# Patient Record
Sex: Male | Born: 1980 | Race: Black or African American | Hispanic: No | Marital: Married | Smoking: Never smoker
Health system: Southern US, Community
[De-identification: ages and names within clinical notes are randomized; demographics above are authoritative.]

---

## 2002-12-24 ENCOUNTER — Emergency Department (HOSPITAL_COMMUNITY): Admission: EM | Admit: 2002-12-24 | Discharge: 2002-12-24 | Payer: Self-pay | Admitting: Emergency Medicine

## 2002-12-24 ENCOUNTER — Encounter: Payer: Self-pay | Admitting: Emergency Medicine

## 2004-04-22 ENCOUNTER — Ambulatory Visit (HOSPITAL_BASED_OUTPATIENT_CLINIC_OR_DEPARTMENT_OTHER): Admission: RE | Admit: 2004-04-22 | Discharge: 2004-04-22 | Payer: Self-pay | Admitting: *Deleted

## 2007-11-01 ENCOUNTER — Emergency Department (HOSPITAL_COMMUNITY): Admission: EM | Admit: 2007-11-01 | Discharge: 2007-11-01 | Payer: Self-pay | Admitting: Emergency Medicine

## 2011-05-14 NOTE — Op Note (Signed)
NAME:  Thomas Foster, Thomas Foster                       ACCOUNT NO.:  [id number]   MEDICAL RECORD NO.:  [id number]                   PATIENT TYPE:  AMB   LOCATION:  DSC                                  FACILITY:  [facility]   PHYSICIAN:  Lowell Bouton, M.D.      DATE OF BIRTH:  1981-11-08   DATE OF PROCEDURE:  04/22/2004  DATE OF DISCHARGE:                                 OPERATIVE REPORT   PREOPERATIVE DIAGNOSIS:  Left scaphoid nonunion.   POSTOPERATIVE DIAGNOSIS:  Left scaphoid nonunion.   PROCEDURE:  Open reduction internal fixation of left scaphoid with left  iliac crest bone graft.   SURGEON:  Lowell Bouton, M.D.   ANESTHESIA:  General.   OPERATIVE FINDINGS:  The patient had an obvious nonunion through the distal  third of the scaphoid.  There were cystic changes at the fracture site and a  hump back deformity.  The articular surfaces were smooth.   PROCEDURE:  Under general anesthesia with a tourniquet on the left arm, the  left hand and left iliac crest were prepped and draped in the usual fashion.  After exsanguinating the limb, the tourniquet was inflated to 250 mmHg.  A  longitudinal incision was made in line with the FCR tendon and obliquely  across the distal flexor crease.  Sharp dissection was carried through the  subcutaneous tissues and bleeding points were coagulated.  Blunt dissection  was carried down to the FCR tendon and the sheath was incised.  The tendon  was retracted and sharp dissection was carried down to the scaphoid  tuberosity and then along the scaphoid to the distal radius.  The fracture  site was easily identified.  The periosteum was elevated radially and  ulnarly and the fracture site was debrided with a rongeur and a curet.  There was good bleeding from both sides of the nonunion site.  A 0.035 K-  wire was placed in the proximal pole of the scaphoid to be used as a  joystick and x-rays were obtained.  Lateral x-rays revealed a DISI  deformity, so with opening the nonunion site using the joystick, this  reduced the lunate.  It was determined that an iliac crest bone graft would  be required.  An oblique incision was then made over the left anterior iliac  crest and sharp dissection carried through the subcutaneous tissues with  electrocautery.  Electrocautery was used to cut down through the periosteum  on the top of the iliac crest.  Two osteotomes were used to take a wedge of  bone out of the top of the iliac crest.  A curved osteotome was used to  remove the deep portion of the graft.  The graft was then contoured and the  iliac crest site was packed open.  After contouring the graft, it was  inserted into the scaphoid nonunion site.  X-rays showed good position of  the lunate and direction of the hump back deformity.  A 0.035  K-wire was  inserted across the graft to hold it in place.  A second K-wire was then  inserted and x-ray showed good position of the K-wire.  The AccuTrac screw  was then repaired and a size 22.5-mm screw was measured.  The x-ray showed  good position of the K-wire, so the drill from the AccuTrac set was placed  over the K-wire and drilled.  This was done using power.  The drill was then  removed and the screw was inserted over the K-wire.  There was good  impression and good position of the screw.  It was recessed just below the  tuberosity's distal portion.  The K-wires were then removed and under  fluoroscopy the scaphoid appeared to be well aligned.  The wound was then  irrigated copiously.  The cancellous bone that remained was packed around  the corticocancellous graft to fill in any defects.  The fascia was then  closed with a 4-0 Vicryl and subcutaneous tissue was closed with 4-0 Vicryl  over a vessel loop drain.  The skin was closed with a 3-0 subcuticular  Prolene.  Steri-Strips were applied followed by sterile dressings and the  tourniquet was released.  Iliac crest donor site was  then irrigated  copiously with saline and bone wax was inserted to control the bleeding of  the bone.  The fascia was then closed with 0 chromic sutures and the  subcutaneous tissue  closed with 2-0 chromic.  The skin was closed with a 4-0 subcuticular  Prolene.  Steri-Strips were applied followed by sterile dressings.  A thumb  spica splint was placed on the wrist.  The patient tolerated the procedure  well and went to the recovery room awake and stable in good condition.                                               Lowell Bouton, M.D.    EMM/MEDQ  D:  04/22/2004  T:  04/22/2004  Job:  [id number]

## 2014-08-17 ENCOUNTER — Encounter (HOSPITAL_COMMUNITY): Payer: Self-pay | Admitting: Emergency Medicine

## 2014-08-17 ENCOUNTER — Emergency Department (HOSPITAL_COMMUNITY)
Admission: EM | Admit: 2014-08-17 | Discharge: 2014-08-17 | Disposition: A | Discharge status: Home / Self Care | Payer: Self-pay | Attending: Emergency Medicine | Admitting: Emergency Medicine

## 2014-08-17 DIAGNOSIS — X500XXA Overexertion from strenuous movement or load, initial encounter: Secondary | ICD-10-CM | POA: Insufficient documentation

## 2014-08-17 DIAGNOSIS — X503XXA Overexertion from repetitive movements, initial encounter: Secondary | ICD-10-CM | POA: Insufficient documentation

## 2014-08-17 DIAGNOSIS — Z79899 Other long term (current) drug therapy: Secondary | ICD-10-CM | POA: Insufficient documentation

## 2014-08-17 DIAGNOSIS — Y929 Unspecified place or not applicable: Secondary | ICD-10-CM | POA: Insufficient documentation

## 2014-08-17 DIAGNOSIS — Y9389 Activity, other specified: Secondary | ICD-10-CM | POA: Insufficient documentation

## 2014-08-17 DIAGNOSIS — S335XXA Sprain of ligaments of lumbar spine, initial encounter: Secondary | ICD-10-CM | POA: Insufficient documentation

## 2014-08-17 DIAGNOSIS — M549 Dorsalgia, unspecified: Secondary | ICD-10-CM | POA: Insufficient documentation

## 2014-08-17 DIAGNOSIS — S39012A Strain of muscle, fascia and tendon of lower back, initial encounter: Secondary | ICD-10-CM

## 2014-08-17 MED ORDER — CYCLOBENZAPRINE HCL 10 MG PO TABS: 10.0000 mg | ORAL_TABLET | Freq: Three times a day (TID) | ORAL | Status: AC | PRN

## 2014-08-17 NOTE — Discharge Instructions (Signed)
Take 600 mg ibuprofen every 6 hours  Back Pain, Adult Low back pain is very common. About 1 in 5 people have back pain.The cause of low back pain is rarely dangerous. The pain often gets better over time.About half of people with a sudden onset of back pain feel better in just 2 weeks. About 8 in 10 people feel better by 6 weeks.  CAUSES Some common causes of back pain include:  Strain of the muscles or ligaments supporting the spine.  Wear and tear (degeneration) of the spinal discs.  Arthritis.  Direct injury to the back. DIAGNOSIS Most of the time, the direct cause of low back pain is not known.However, back pain can be treated effectively even when the exact cause of the pain is unknown.Answering your caregiver's questions about your overall health and symptoms is one of the most accurate ways to make sure the cause of your pain is not dangerous. If your caregiver needs more information, he or she may order lab work or imaging tests (X-rays or MRIs).However, even if imaging tests show changes in your back, this usually does not require surgery. HOME CARE INSTRUCTIONS For many people, back pain returns.Since low back pain is rarely dangerous, it is often a condition that people can learn to [facility]ageon their own.   Remain active. It is stressful on the back to sit or stand in one place. Do not sit, drive, or stand in one place for more than 30 minutes at a time. Take short walks on level surfaces as soon as pain allows.Try to increase the length of time you walk each day.  Do not stay in bed.Resting more than 1 or 2 days can delay your recovery.  Do not avoid exercise or work.Your body is made to move.It is not dangerous to be active, even though your back may hurt.Your back will likely heal faster if you return to being active before your pain is gone.  Pay attention to your body when you bend and lift. Many people have less discomfortwhen lifting if they bend their knees,  keep the load close to their bodies,and avoid twisting. Often, the most comfortable positions are those that put less stress on your recovering back.  Find a comfortable position to sleep. Use a firm mattress and lie on your side with your knees slightly bent. If you lie on your back, put a pillow under your knees.  Only take over-the-counter or prescription medicines as directed by your caregiver. Over-the-counter medicines to reduce pain and inflammation are often the most helpful.Your caregiver may prescribe muscle relaxant drugs.These medicines help dull your pain so you can more quickly return to your normal activities and healthy exercise.  Put ice on the injured area.  Put ice in a plastic bag.  Place a towel between your skin and the bag.  Leave the ice on for 15-20 minutes, 03-04 times a day for the first 2 to 3 days. After that, ice and heat may be alternated to reduce pain and spasms.  Ask your caregiver about trying back exercises and gentle massage. This may be of some benefit.  Avoid feeling anxious or stressed.Stress increases muscle tension and can worsen back pain.It is important to recognize when you are anxious or stressed and learn ways to manage it.Exercise is a great option. SEEK MEDICAL CARE IF:  You have pain that is not relieved with rest or medicine.  You have pain that does not improve in 1 week.  You have new symptoms.  You are generally not feeling well. SEEK IMMEDIATE MEDICAL CARE IF:   You have pain that radiates from your back into your legs.  You develop new bowel or bladder control problems.  You have unusual weakness or numbness in your arms or legs.  You develop nausea or vomiting.  You develop abdominal pain.  You feel faint. Document Released: 12/13/2005 Document Revised: 06/13/2012 Document Reviewed: 04/16/2014 [facility] Patient Information 2015 [place], [place]. This information is not intended to replace advice given to you by your  health care provider. Make sure you discuss any questions you have with your health care provider.

## 2014-08-17 NOTE — ED Notes (Signed)
Patient complains of lower back pain that started Thursday. States lifting a box, around 60 lbs, and setting it down started having back pain. Patient states he tried stretching back Friday morning with some improvement. States was standing and pain returned. States sitting makes pain worse.

## 2014-08-17 NOTE — ED Provider Notes (Signed)
CSN: [id number]     Arrival date & time 08/17/14  0805 History   First MD Initiated Contact with Patient 08/17/14 [phone]     Chief Complaint  Patient presents with  . Back Pain     HPI Patient complains of lower back pain that started Thursday. States lifting a box, around 60 lbs, and setting it down started having back pain. Patient states he tried stretching back Friday morning with some improvement. States was standing and pain returned. States sitting makes pain worse.  History reviewed. No pertinent past medical history. History reviewed. No pertinent past surgical history. No family history on file. History  Substance Use Topics  . Smoking status: Never Smoker   . Smokeless tobacco: Not on file  . Alcohol Use: No    Review of Systems  All other systems reviewed and are negative  Allergies  Review of patient's allergies indicates no known allergies.  Home Medications   Prior to Admission medications   Medication Sig Start Date End Date Taking? Authorizing Provider  cyclobenzaprine (FLEXERIL) 10 MG tablet Take 1 tablet (10 mg total) by mouth 3 (three) times daily as needed for muscle spasms. 08/17/14   Nelia Shiobert L Evanell Redlich, MD   BP 141/85  Pulse 61  Temp(Src) 97.9 F (36.6 C) (Oral)  SpO2 100% Physical Exam  Musculoskeletal:       Lumbar back: He exhibits tenderness, pain and spasm. He exhibits no bony tenderness.   Physical Exam  Nursing note and vitals reviewed. Constitutional: He is oriented to person, place, and time. He appears well-developed and well-nourished. No distress.  HENT:  Head: Normocephalic and atraumatic.  Eyes: Pupils are equal, round, and reactive to light.  Neck: Normal range of motion.  Cardiovascular: Normal rate and intact distal pulses.   Pulmonary/Chest: No respiratory distress.  Abdominal: Normal appearance. He exhibits no distension.  Neurology: No weakness, numbness, no ataxia or walking difficult.  No focal or lateralizing neurologic  findings Skin: Skin is warm and dry. No rash noted.  Psychiatric: He has a normal mood and affect. His behavior is normal.   ED Course  Procedures (including critical care time)  Medications - No data to display  Labs Review Labs Reviewed - No data to display  Imaging Review No results found.    MDM   Final diagnoses:  Back strain, initial encounter        Nelia Shiobert L Shefali Ng, MD 08/25/14 1640

## 2019-10-28 ENCOUNTER — Encounter (HOSPITAL_COMMUNITY): Payer: Self-pay | Admitting: *Deleted

## 2019-10-28 ENCOUNTER — Ambulatory Visit (HOSPITAL_COMMUNITY)
Admission: EM | Admit: 2019-10-28 | Discharge: 2019-10-28 | Disposition: A | Discharge status: Home / Self Care | Payer: 59 | Attending: Physician Assistant | Admitting: Physician Assistant

## 2019-10-28 ENCOUNTER — Other Ambulatory Visit: Payer: Self-pay

## 2019-10-28 DIAGNOSIS — R1032 Left lower quadrant pain: Secondary | ICD-10-CM

## 2019-10-28 MED ORDER — METRONIDAZOLE 500 MG PO TABS: 500.0000 mg | ORAL_TABLET | Freq: Three times a day (TID) | ORAL | 0 refills | Status: AC

## 2019-10-28 MED ORDER — CIPROFLOXACIN HCL 500 MG PO TABS
500.0000 mg | ORAL_TABLET | Freq: Two times a day (BID) | ORAL | 0 refills | Status: AC
Start: 2019-10-28 — End: ?

## 2019-10-28 NOTE — ED Triage Notes (Signed)
C/O intermittent LLQ abd pain x 2 wks with progressive worsening.  Denies any n/v/d.  Denies fevers.  Last BM this AM.  Denies constipation.  Denies any bulging to area.

## 2019-10-28 NOTE — ED Provider Notes (Signed)
[facility]    CSN: [id number] Arrival date & time: 10/28/19  1003      History   Chief Complaint Chief Complaint  Patient presents with  . Abdominal Pain    HPI Thomas Foster is a 38 y.o. male.   Patient here concerned with LLQ abdominal pain x 2 weeks which does not radiate.  Pain is constant, which worsens occasionally.  Pain currently 2/10 on pain scale, maximally 5/10.  States when pain worsens it lasts for minutes, usually only happens once per day.  He has not tried any medication for this pain.  Denies fever, chills, nausea, vomiting, diarrhea, constipation, hematochezia, melena, mucus in stool, GU symptoms, denies masses or bulges.  Nothing makes the pain better, nothing makes it worse.  He thinks it could be due to lifting boxes at work.  No remarkable PMH.     History reviewed. No pertinent past medical history.  There are no active problems to display for this patient.   History reviewed. No pertinent surgical history.     Home Medications    Prior to Admission medications   Medication Sig Start Date End Date Taking? Authorizing Provider  ciprofloxacin (CIPRO) 500 MG tablet Take 1 tablet (500 mg total) by mouth 2 (two) times daily. 10/28/19   Evern CoreLindquist, Erilyn Pearman, PA-C  cyclobenzaprine (FLEXERIL) 10 MG tablet Take 1 tablet (10 mg total) by mouth 3 (three) times daily as needed for muscle spasms. 08/17/14   Nelva NayBeaton, Robert, MD  metroNIDAZOLE (FLAGYL) 500 MG tablet Take 1 tablet (500 mg total) by mouth 3 (three) times daily for 7 days. 10/28/19 11/04/19  Evern CoreLindquist, Dalyce Renne, PA-C    Family History Family History  Problem Relation Age of Onset  . Healthy Mother   . Healthy Father     Social History Social History   Tobacco Use  . Smoking status: Never Smoker  . Smokeless tobacco: Never Used  Substance Use Topics  . Alcohol use: Yes    Comment: occasionally  . Drug use: Never     Allergies   Patient has no known allergies.   Review of  Systems Review of Systems  Constitutional: Negative for activity change, appetite change, chills, fatigue and fever.  Gastrointestinal: Positive for abdominal pain. Negative for abdominal distention, blood in stool, constipation, diarrhea, nausea, rectal pain and vomiting.  Genitourinary: Negative for decreased urine volume, difficulty urinating, discharge, dysuria, flank pain, genital sores, hematuria, penile pain, scrotal swelling, testicular pain and urgency.  Musculoskeletal: Negative for back pain, gait problem and myalgias.  Skin: Negative for color change, pallor and rash.  Neurological: Negative for dizziness, light-headedness and headaches.  Hematological: Negative for adenopathy. Does not bruise/bleed easily.  Psychiatric/Behavioral: Negative for confusion, dysphoric mood and sleep disturbance.  All other systems reviewed and are negative.    Physical Exam Triage Vital Signs ED Triage Vitals  Enc Vitals Group     BP 10/28/19 1012 (!) 150/93     Pulse Rate 10/28/19 1012 75     Resp 10/28/19 1012 16     Temp 10/28/19 1012 (!) 97.3 F (36.3 C)     Temp Source 10/28/19 1012 Other     SpO2 10/28/19 1012 97 %     Weight --      Height --      Head Circumference --      Peak Flow --      Pain Score 10/28/19 1013 2     Pain Loc --  Pain Edu? --      Excl. in GC? --    No data found.  Updated Vital Signs BP (!) 150/93   Pulse 75   Temp (!) 97.3 F (36.3 C) (Other (Comment))   Resp 16   SpO2 97%   Visual Acuity Right Eye Distance:   Left Eye Distance:   Bilateral Distance:    Right Eye Near:   Left Eye Near:    Bilateral Near:     Physical Exam Vitals signs and nursing note reviewed.  Constitutional:      General: He is not in acute distress.    Appearance: He is well-developed. He is not ill-appearing or toxic-appearing.  HENT:     Head: Normocephalic and atraumatic.  Eyes:     Conjunctiva/sclera: Conjunctivae normal.  Neck:     Musculoskeletal:  Neck supple.  Cardiovascular:     Rate and Rhythm: Normal rate and regular rhythm.     Heart sounds: No murmur.  Pulmonary:     Effort: Pulmonary effort is normal. No respiratory distress.     Breath sounds: Normal breath sounds.  Abdominal:     General: Abdomen is flat. Bowel sounds are normal. There is no distension.     Palpations: Abdomen is soft. There is no shifting dullness, fluid wave, hepatomegaly or splenomegaly.     Tenderness: There is abdominal tenderness in the left lower quadrant. There is no right CVA tenderness, left CVA tenderness, guarding or rebound. Negative signs include Rovsing's sign and psoas sign.     Hernia: No hernia is present. There is no hernia in the umbilical area, ventral area, left inguinal area or right inguinal area.  Genitourinary:    Penis: Normal.      Scrotum/Testes: Normal. Cremasteric reflex is present.        Right: Mass, tenderness or swelling not present.        Left: Mass, tenderness or swelling not present.     Epididymis:     Right: Normal.     Left: Normal.  Skin:    General: Skin is warm and dry.     Capillary Refill: Capillary refill takes less than 2 seconds.  Neurological:     General: No focal deficit present.     Mental Status: He is alert and oriented to person, place, and time.     Cranial Nerves: No cranial nerve deficit.     Motor: No weakness.  Psychiatric:        Mood and Affect: Mood normal.        Behavior: Behavior normal.      UC Treatments / Results  Labs (all labs ordered are listed, but only abnormal results are displayed) Labs Reviewed - No data to display UA - small blood in urine, otherwise normal.  EKG   Radiology No results found.  Procedures Procedures (including critical care time)  Medications Ordered in UC Medications - No data to display  Initial Impression / Assessment and Plan / UC Course  I have reviewed the triage vital signs and the nursing notes.  Pertinent labs & imaging  results that were available during my care of the patient were reviewed by me and considered in my medical decision making (see chart for details).      Final Clinical Impressions(s) / UC Diagnoses   Final diagnoses:  Left lower quadrant abdominal pain  Due to location and nature of pain, will treat for diverticulitis, patient instructed to follow up with PCP in  2 - 3 days, or go to ER with worsening symptoms. Side effects of medication discussed in detail.   Discharge Instructions     Follow up with PCP if no improvement in 2 - 3 days. Go to ER with worsening symptoms. Take medication as prescribed. Do not drink alcohol while on antibiotics.    ED Prescriptions    Medication Sig Dispense Auth. Provider   ciprofloxacin (CIPRO) 500 MG tablet Take 1 tablet (500 mg total) by mouth 2 (two) times daily. 14 tablet Peri Jefferson, PA-C   metroNIDAZOLE (FLAGYL) 500 MG tablet Take 1 tablet (500 mg total) by mouth 3 (three) times daily for 7 days. 21 tablet Peri Jefferson, PA-C     PDMP not reviewed this encounter.   Peri Jefferson, PA-C 10/28/19 1107

## 2019-10-28 NOTE — Discharge Instructions (Signed)
Follow up with PCP if no improvement in 2 - 3 days. Go to ER with worsening symptoms. Take medication as prescribed. Do not drink alcohol while on antibiotics.

## 2019-10-29 LAB — POCT URINALYSIS DIP (DEVICE)
Bilirubin Urine: NEGATIVE
Glucose, UA: NEGATIVE mg/dL
Ketones, ur: NEGATIVE mg/dL
Leukocytes,Ua: NEGATIVE
Nitrite: NEGATIVE
Protein, ur: NEGATIVE mg/dL
Specific Gravity, Urine: >=1.03 (ref 1.005–1.030)
Urobilinogen, UA: 0.2 mg/dL (ref 0.0–1.0)
pH: 5.5 (ref 5.0–8.0)

## 2020-06-11 ENCOUNTER — Emergency Department (HOSPITAL_COMMUNITY): Payer: 59

## 2020-06-11 ENCOUNTER — Encounter (HOSPITAL_COMMUNITY): Payer: Self-pay | Admitting: *Deleted

## 2020-06-11 ENCOUNTER — Emergency Department (HOSPITAL_COMMUNITY)
Admission: EM | Admit: 2020-06-11 | Discharge: 2020-06-11 | Disposition: A | Discharge status: Home / Self Care | Payer: 59 | Attending: Emergency Medicine | Admitting: Emergency Medicine

## 2020-06-11 ENCOUNTER — Other Ambulatory Visit: Payer: Self-pay

## 2020-06-11 DIAGNOSIS — M25561 Pain in right knee: Secondary | ICD-10-CM

## 2020-06-11 DIAGNOSIS — M25531 Pain in right wrist: Secondary | ICD-10-CM | POA: Diagnosis present

## 2020-06-11 NOTE — ED Provider Notes (Signed)
  Face-to-face evaluation   History: Injury right wrist and right knee and a fall 1 week ago.  He has mild pain with movement, and no other complaints.  Physical exam: Alert, calm, cooperative.  No apparent distress.  He is using both hands to manipulate his telephone when I saw him.  Wrist is without obvious swelling and has normal range of motion.  Clinical Course as of Jun 11 2301  Wed Jun 11, 2020  2225 No fracture, interpreted by me  DG Wrist Complete Right [EW]  2225 No fracture, interpreted by me  DG Knee Complete 4 Views Right [EW]    Clinical Course User Index [EW] Mancel Bale, MD    Medical screening examination/treatment/procedure(s) were conducted as a shared visit with non-physician practitioner(s) and myself.  I personally evaluated the patient during the encounter    Mancel Bale, MD 06/11/20 2302

## 2020-06-11 NOTE — Discharge Instructions (Addendum)
Please make sure to continue to take over-the-counter Tylenol or ibuprofen for pain.  With ibuprofen, please be cautious about kidney damage and bleeding.  If your symptoms are worsening or do not improve, please follow-up with the orthopedics office provided.  Return to the ER if you have sudden numbness, tingling, weakness, and inability to walk.

## 2020-06-11 NOTE — ED Notes (Signed)
Patient verbalizes understanding of discharge instructions. Opportunity for questioning and answers were provided. Armband removed by staff, pt discharged from ED ambulatory.   
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
## 2020-06-11 NOTE — ED Provider Notes (Signed)
[facility] EMERGENCY DEPARTMENT Provider Note   CSN: [id number] Arrival date & time: 06/11/20  1900     History Chief Complaint  Patient presents with  . Wrist Pain  . Knee Pain    Thomas Foster is a 39 y.o. male.  HPI 39 year old male with no significant medical history presents to the ER for right wrist and right knee pain.  Patient states that he was playing basketball with some friends approximately a week ago and was pushed backwards.  He caught himself with his wrist and fell backwards.  He does not know of any specific injury to his right knee.  Notes pain to his right wrist, mildly decreased range of motion secondary to pain.  He also states that he has been having some intermittent swelling to his right knee, though it is not currently swollen.  He has been able to ambulate without difficulty.  He has taken over-the-counter ibuprofen for his pain.  Denies any weakness, numbness, tingling, fevers, chills.  No history IVDU.  No other complaints today.    History reviewed. No pertinent past medical history.  There are no problems to display for this patient.   History reviewed. No pertinent surgical history.     Family History  Problem Relation Age of Onset  . Healthy Mother   . Healthy Father     Social History   Tobacco Use  . Smoking status: Never Smoker  . Smokeless tobacco: Never Used  Vaping Use  . Vaping Use: Never used  Substance Use Topics  . Alcohol use: Yes    Comment: occasionally  . Drug use: Never    Home Medications Prior to Admission medications   Medication Sig Start Date End Date Taking? Authorizing Provider  ciprofloxacin (CIPRO) 500 MG tablet Take 1 tablet (500 mg total) by mouth 2 (two) times daily. 10/28/19   Evern Core, PA-C  cyclobenzaprine (FLEXERIL) 10 MG tablet Take 1 tablet (10 mg total) by mouth 3 (three) times daily as needed for muscle spasms. 08/17/14   Nelva Nay, MD    Allergies    Patient  has no known allergies.  Review of Systems   Review of Systems  Musculoskeletal: Positive for arthralgias. Negative for back pain, neck pain and neck stiffness.  Skin: Negative for rash and wound.    Physical Exam Updated Vital Signs BP (!) 161/87 (BP Location: Right Arm)   Pulse 74   Temp 98.7 F (37.1 C) (Oral)   Resp 20   Ht 5\' 11"  (1.803 m)   Wt 87.1 kg   SpO2 99%   BMI 26.78 kg/m   Physical Exam Vitals and nursing note reviewed.  Constitutional:      General: He is not in acute distress.    Appearance: Normal appearance. He is well-developed. He is not ill-appearing, toxic-appearing or diaphoretic.  HENT:     Head: Normocephalic and atraumatic.     Nose: Nose normal.     Mouth/Throat:     Mouth: Mucous membranes are moist.  Eyes:     Conjunctiva/sclera: Conjunctivae normal.     Pupils: Pupils are equal, round, and reactive to light.  Cardiovascular:     Rate and Rhythm: Normal rate and regular rhythm.     Pulses: Normal pulses.     Heart sounds: Normal heart sounds. No murmur heard.   Pulmonary:     Effort: Pulmonary effort is normal. No respiratory distress.     Breath sounds: Normal breath sounds.  Abdominal:     Palpations: Abdomen is soft.     Tenderness: There is no abdominal tenderness.  Musculoskeletal:        General: Tenderness present. No swelling, deformity or signs of injury.       Arms:     Cervical back: Normal range of motion and neck supple.     Right lower leg: No edema.     Left lower leg: No edema.       Legs:     Comments: Right wrist with mild tenderness to palpation over ulna.  5/5 strength in hand, full range of motion of fingers.  2+ radial pulses.  Slightly decreased range of motion of right wrist secondary to pain.  <2 cap refill.  Sensations intact.  No snuffbox tenderness.  No noticeable crepitus, erythema, swelling, abscesses.  Right knee with no evidence of ecchymosis, erythema, swelling.  Mild tenderness to palpation to the  superior portion of the knee.  Full range of motion of joint, 5/5 strength and sensations intact in lower extremities.  Negative Lachman's.  Skin:    General: Skin is warm and dry.     Capillary Refill: Capillary refill takes less than 2 seconds.     Findings: No bruising, erythema or rash.  Neurological:     General: No focal deficit present.     Mental Status: He is alert and oriented to person, place, and time.     Sensory: No sensory deficit.     Motor: No weakness.  Psychiatric:        Mood and Affect: Mood normal.     ED Results / Procedures / Treatments   Labs (all labs ordered are listed, but only abnormal results are displayed) Labs Reviewed - No data to display  EKG None  Radiology No results found.  Procedures Procedures (including critical care time)  Medications Ordered in ED Medications - No data to display  ED Course  I have reviewed the triage vital signs and the nursing notes.  Pertinent labs & imaging results that were available during my care of the patient were reviewed by me and considered in my medical decision making (see chart for details).    MDM Rules/Calculators/A&P                         39 year old male with right wrist and right knee pain after falling while playing basketball. Physical exam without neurologic deficits.  No noticeable erythema, bruising, swelling to the right wrist or the right knee.  Right wrist with mild tenderness to palpation over the right ulna, and mildly decreased range of motion secondary to pain.  No snuffbox tenderness, no crepitus, deformities, erythema, swelling.  Strong pulses.  Right knee with no evidence of effusion, deformities, bruising.  Mild tendon tenderness to the anterior portion of the knee.  Negative Lachman's.  Strength and sensations intact.  Will order plain films to rule out fractures. Concern for meniscus, ACL tear low.   Signed out care to Dr. Eulis Foster. He will follow up on plain films and determine  appropriate disposition. Suspect patient will be stable for follow-up with orthopedics.   Final Clinical Impression(s) / ED Diagnoses Final diagnoses:  Right wrist pain  Acute pain of right knee    Rx / DC Orders ED Discharge Orders    None       Thomas Foster 06/11/20 2158    Daleen Bo, MD 06/11/20 2303

## 2021-03-08 IMAGING — CR DG KNEE COMPLETE 4+V*R*
4 series · 4 of 4 positions shown · non-contrast
Comparison: None.

CLINICAL DATA: Fall, knee pain

EXAM:
RIGHT KNEE - COMPLETE 4+ VIEW

[knee ap]
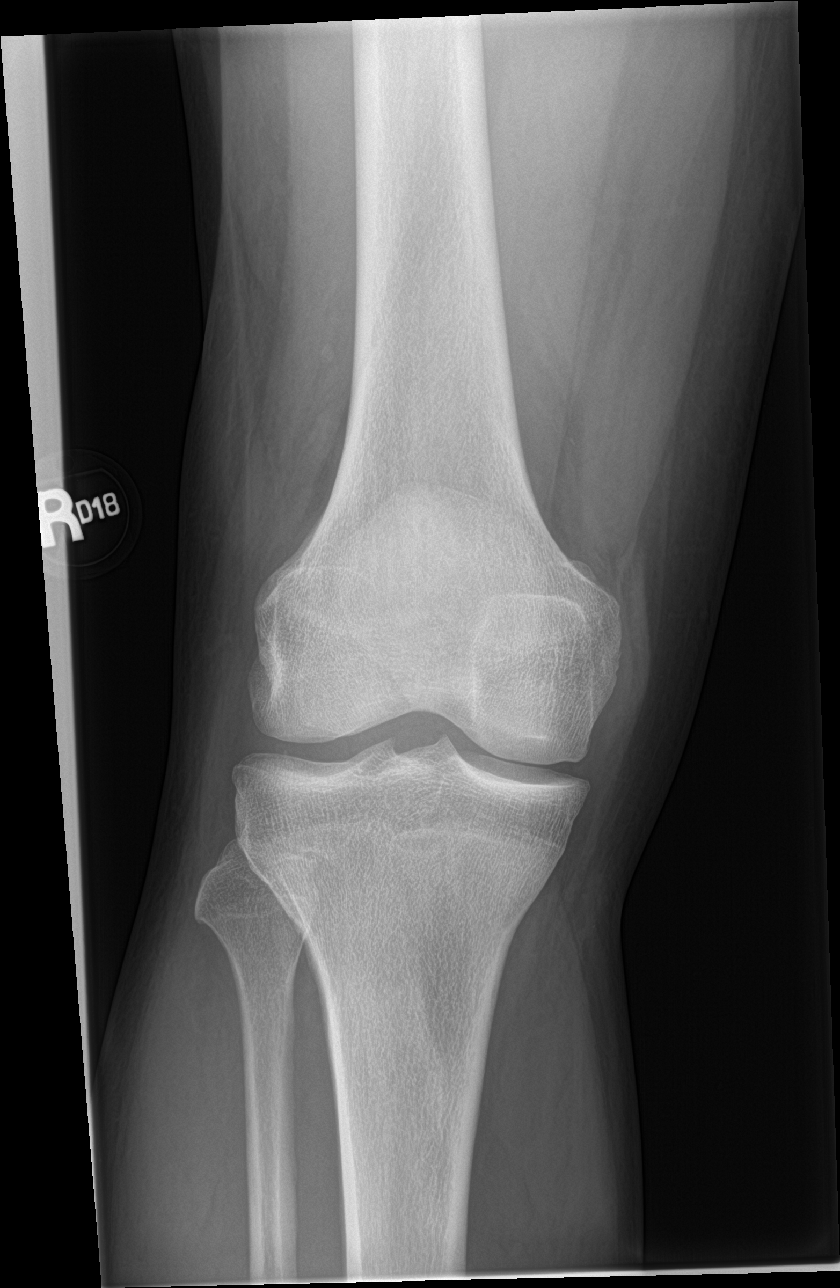

[knee lat]
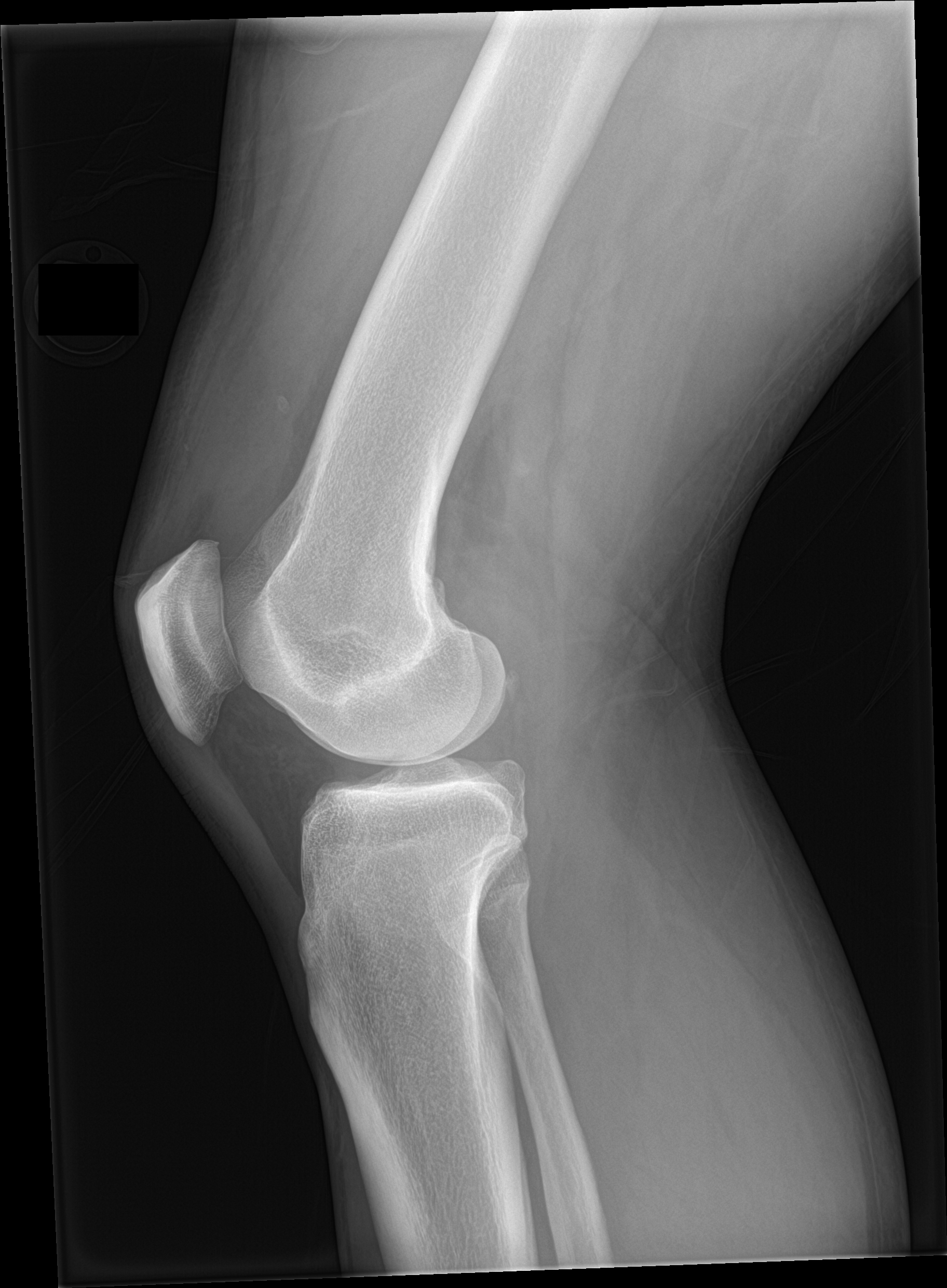

[knee obl (1 of 2)]
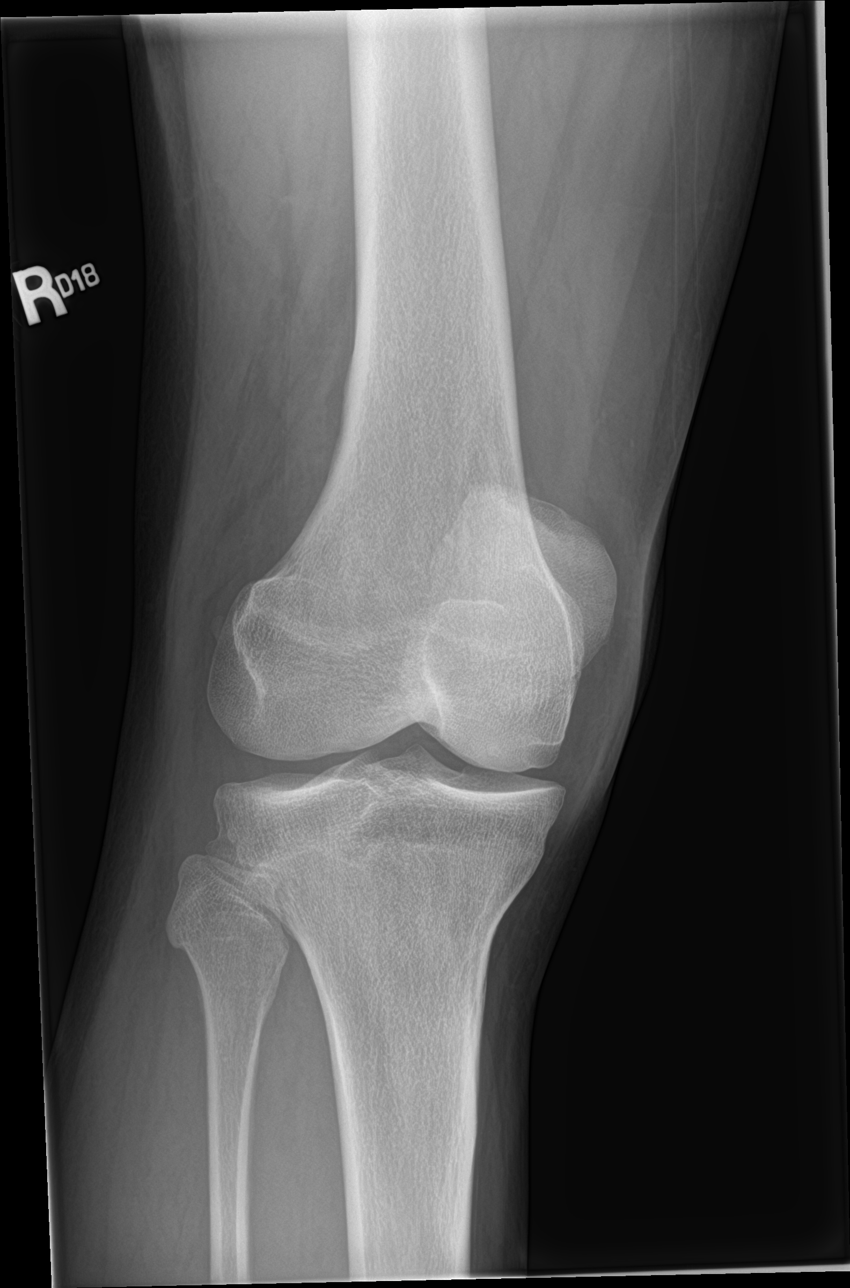

[knee obl (2 of 2)]
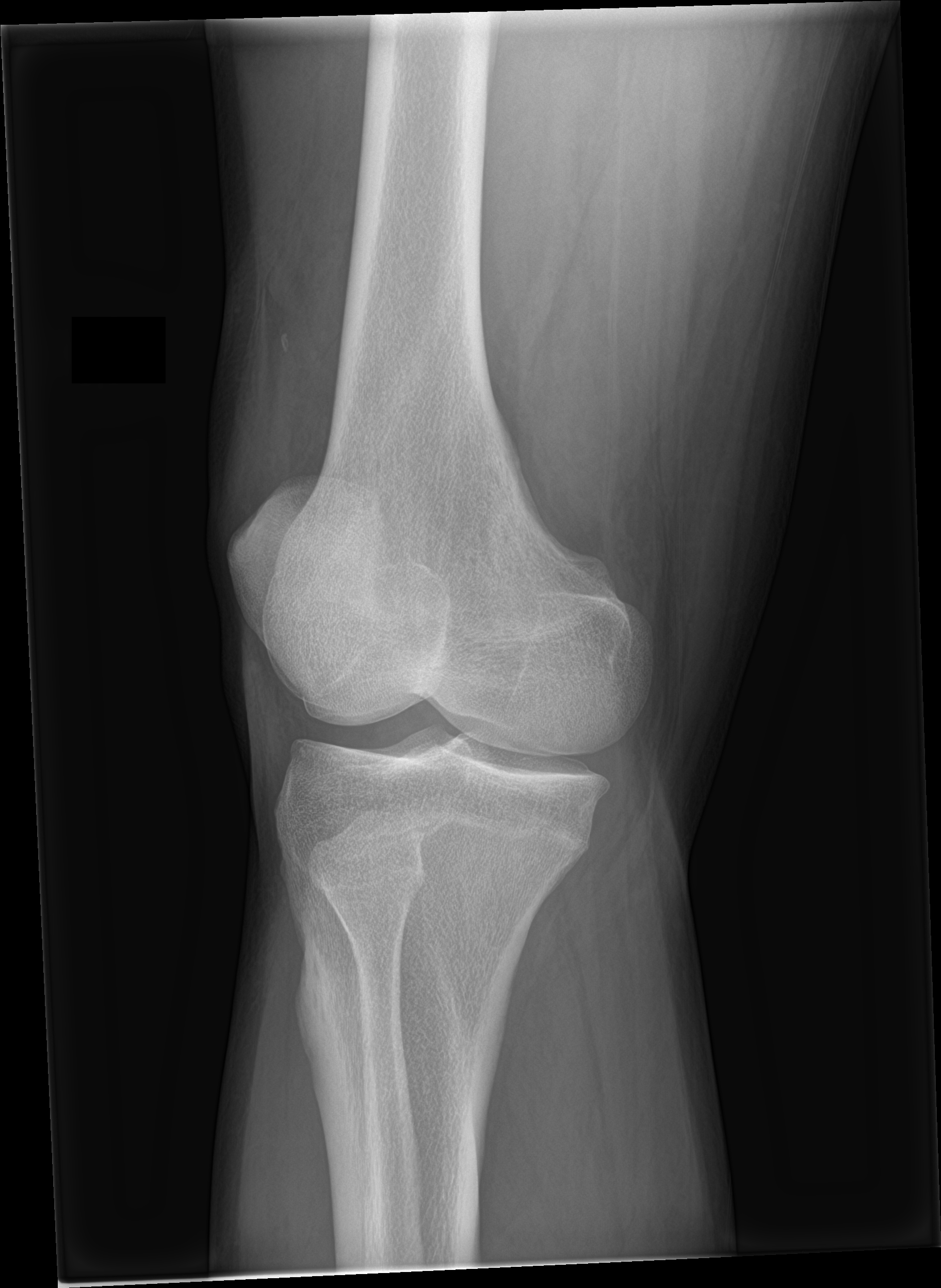

[4 of 4 positions shown; findings below may reference images not displayed]

FINDINGS: Small joint effusion. Joint spaces maintained. No acute bony
abnormality. Specifically, no fracture, subluxation, or dislocation.
IMPRESSION: No acute bony abnormality.  Small joint effusion.

## 2024-01-05 ENCOUNTER — Emergency Department (HOSPITAL_BASED_OUTPATIENT_CLINIC_OR_DEPARTMENT_OTHER)
Admission: EM | Admit: 2024-01-05 | Discharge: 2024-01-05 | Disposition: A | Discharge status: Home / Self Care | Payer: 59 | Attending: Emergency Medicine | Admitting: Emergency Medicine

## 2024-01-05 ENCOUNTER — Other Ambulatory Visit: Payer: Self-pay

## 2024-01-05 ENCOUNTER — Encounter (HOSPITAL_BASED_OUTPATIENT_CLINIC_OR_DEPARTMENT_OTHER): Payer: Self-pay

## 2024-01-05 DIAGNOSIS — W228XXA Striking against or struck by other objects, initial encounter: Secondary | ICD-10-CM | POA: Diagnosis not present

## 2024-01-05 DIAGNOSIS — Y990 Civilian activity done for income or pay: Secondary | ICD-10-CM | POA: Insufficient documentation

## 2024-01-05 DIAGNOSIS — R55 Syncope and collapse: Secondary | ICD-10-CM | POA: Diagnosis not present

## 2024-01-05 DIAGNOSIS — S0990XA Unspecified injury of head, initial encounter: Secondary | ICD-10-CM | POA: Insufficient documentation

## 2024-01-05 LAB — CBC
HCT: 42.4 % (ref 39.0–52.0)
Hemoglobin: 15.1 g/dL (ref 13.0–17.0)
MCH: 27.5 pg (ref 26.0–34.0)
MCHC: 35.6 g/dL (ref 30.0–36.0)
MCV: 77.2 fL — ABNORMAL LOW (ref 80.0–100.0)
Platelets: 211 10*3/uL (ref 150–400)
RBC: 5.49 MIL/uL (ref 4.22–5.81)
RDW: 13.2 % (ref 11.5–15.5)
WBC: 7.1 10*3/uL (ref 4.0–10.5)
nRBC: 0 % (ref 0.0–0.2)

## 2024-01-05 LAB — URINALYSIS, ROUTINE W REFLEX MICROSCOPIC
Glucose, UA: NEGATIVE mg/dL
Ketones, ur: NEGATIVE mg/dL
Leukocytes,Ua: NEGATIVE
Nitrite: NEGATIVE
Protein, ur: 30 mg/dL — AB
Specific Gravity, Urine: 1.025 (ref 1.005–1.030)
pH: 6 (ref 5.0–8.0)

## 2024-01-05 LAB — CBG MONITORING, ED: Glucose-Capillary: 143 mg/dL — ABNORMAL HIGH (ref 70–99)

## 2024-01-05 LAB — URINALYSIS, MICROSCOPIC (REFLEX)

## 2024-01-05 LAB — BASIC METABOLIC PANEL WITH GFR
Anion gap: 7 (ref 5–15)
BUN: 19 mg/dL (ref 6–20)
CO2: 26 mmol/L (ref 22–32)
Calcium: 8.8 mg/dL — ABNORMAL LOW (ref 8.9–10.3)
Chloride: 101 mmol/L (ref 98–111)
Creatinine, Ser: 1.29 mg/dL — ABNORMAL HIGH (ref 0.61–1.24)
GFR, Estimated: >60 mL/min
Glucose, Bld: 156 mg/dL — ABNORMAL HIGH (ref 70–99)
Potassium: 3.6 mmol/L (ref 3.5–5.1)
Sodium: 134 mmol/L — ABNORMAL LOW (ref 135–145)

## 2024-01-05 NOTE — Discharge Instructions (Signed)
 Your workup today was reassuring, you may expect to have some lingering headache over the next 1 to 2 days because of your head injury, you may take ibuprofen, Tylenol as needed, please drink plenty of fluids, if you continue to have episodes of feeling lightheaded or passing out I would recommend discontinuing your intermittent fasting.

## 2024-01-05 NOTE — ED Triage Notes (Signed)
 Pt states he was at work and bent down and came back up and sat down and then passed out. Pt c/o head pain from the fall. Denies any chest pain or sob.

## 2024-01-05 NOTE — ED Provider Notes (Signed)
 [place] EMERGENCY DEPARTMENT AT [facility] Provider Note   CSN: [id number] Arrival date & time: 01/05/24  1531     History  Chief Complaint  Patient presents with   Loss of Consciousness    Thomas Foster is a 43 y.o. male with overall noncontributory past medical history presents concern for syncope, collapse today while at work.  Patient reports that he bent down and came back up.  He reports he felt briefly lightheaded and then sat down and passed out.  He does endorse striking the right temple from seated position.  He denies any headache, chest pain, shortness of breath.  No previous episodes of syncope.  Patient reports he has been doing some intermittent fasting, but did have something to eat 2 hours before this incident.  He is feeling better at this time with no pain, no feeling of lightheadedness.   Loss of Consciousness      Home Medications Prior to Admission medications   Medication Sig Start Date End Date Taking? Authorizing Provider  ciprofloxacin  (CIPRO ) 500 MG tablet Take 1 tablet (500 mg total) by mouth 2 (two) times daily. 10/28/19   Juleen Rush, PA-C  cyclobenzaprine  (FLEXERIL ) 10 MG tablet Take 1 tablet (10 mg total) by mouth 3 (three) times daily as needed for muscle spasms. 08/17/14   Lynnea Charleston, MD      Allergies    Patient has no known allergies.    Review of Systems   Review of Systems  Cardiovascular:  Positive for syncope.  All other systems reviewed and are negative.   Physical Exam Updated Vital Signs BP (!) 137/94 (BP Location: Left Arm)   Pulse 83   Temp 97.6 F (36.4 C) (Oral)   Resp 16   SpO2 96%  Physical Exam Vitals and nursing note reviewed.  Constitutional:      General: He is not in acute distress.    Appearance: Normal appearance.  HENT:     Head: Normocephalic and atraumatic.  Eyes:     General:        Right eye: No discharge.        Left eye: No discharge.  Cardiovascular:     Rate and Rhythm:  Normal rate and regular rhythm.     Heart sounds: No murmur heard.    No friction rub. No gallop.  Pulmonary:     Effort: Pulmonary effort is normal.     Breath sounds: Normal breath sounds.  Abdominal:     General: Bowel sounds are normal.     Palpations: Abdomen is soft.  Skin:    General: Skin is warm and dry.     Capillary Refill: Capillary refill takes less than 2 seconds.     Comments: No signs of significant trauma, no laceration, no abrasion on right frontal forehead  Neurological:     Mental Status: He is alert and oriented to person, place, and time.     Comments: Cranial nerves II through XII grossly intact.  Intact finger-nose, intact heel-to-shin.  Romberg negative, gait normal.  Alert and oriented x3.  Moves all 4 limbs spontaneously, normal coordination.  No pronator drift.  Intact strength 5 out of 5 bilateral upper and lower extremities.   Psychiatric:        Mood and Affect: Mood normal.        Behavior: Behavior normal.     ED Results / Procedures / Treatments   Labs (all labs ordered are listed, but only abnormal  results are displayed) Labs Reviewed  BASIC METABOLIC PANEL - Abnormal; Notable for the following components:      Result Value   Sodium 134 (*)    Glucose, Bld 156 (*)    Creatinine, Ser 1.29 (*)    Calcium 8.8 (*)    All other components within normal limits  CBC - Abnormal; Notable for the following components:   MCV 77.2 (*)    All other components within normal limits  URINALYSIS, ROUTINE W REFLEX MICROSCOPIC - Abnormal; Notable for the following components:   Hgb urine dipstick TRACE (*)    Bilirubin Urine SMALL (*)    Protein, ur 30 (*)    All other components within normal limits  URINALYSIS, MICROSCOPIC (REFLEX) - Abnormal; Notable for the following components:   Bacteria, UA RARE (*)    All other components within normal limits  CBG MONITORING, ED - Abnormal; Notable for the following components:   Glucose-Capillary 143 (*)    All  other components within normal limits    EKG None  Radiology No results found.  Procedures Procedures    Medications Ordered in ED Medications - No data to display  ED Course/ Medical Decision Making/ A&P                                 Medical Decision Making Amount and/or Complexity of Data Reviewed Labs: ordered.   This patient is a 43 y.o. male  who presents to the ED for concern of syncope, collapse, head injury.   Differential diagnoses prior to evaluation: The emergent differential diagnosis includes, but is not limited to,  epidural hematoma, subdural hematoma, skull fracture, subarachnoid hemorrhage, unstable cervical spine fracture, concussion vs other MSK injury, CVA, ACS, arrhythmia, vasovagal syncope, orthostatic hypotension, sepsis, hypoglycemia, electrolyte disturbance, respiratory failure, symptomatic anemia, dehydration, heat injury, polypharmacy, malignancy, anxiety/panic attack . This is not an exhaustive differential.   Past Medical History / Co-morbidities / Social History: Non contributory  Physical Exam: Physical exam performed. The pertinent findings include: Cranial nerves II through XII grossly intact.  Intact finger-nose, intact heel-to-shin.  Romberg negative, gait normal.  Alert and oriented x3.  Moves all 4 limbs spontaneously, normal coordination.  No pronator drift.  Intact strength 5 out of 5 bilateral upper and lower extremities.  No signs of significant trauma, no laceration, no abrasion on right frontal forehead   Lab Tests/Imaging studies: I personally interpreted labs/imaging and the pertinent results include: CBC unremarkable, notably with no anemia, BMP with very mild hyponatremia, sodium 134, creatinine is elevated at 1.29 but no recent baseline, suspect that he may be mildly dehydrated but he does not appear fluid down on exam.  UA does note some trace hemoglobin, small bilirubin of unclear significance, not having significant abdominal  pain.  Do not suspect acute urinary tract infection..  Discussed with patient about obtaining a CT of his head given his fall and head injury, but with no evidence of trauma on exam, no focal neurologic deficits, and patient reporting fall was from a seated position I think it is reasonable to forego head imaging at this time.  Patient agrees with this plan.    Cardiac monitoring: EKG obtained and interpreted by myself and attending physician which shows: NSR   Disposition: After consideration of the diagnostic results and the patients response to treatment, I feel that patient likely with vasovagal syncope/orthostatic syncope secondary to standing up quickly, he  has returned normal baseline, he has overall normotensive blood pressure other than mild diastolic hypertension, blood pressure 137/94.  His lab work is overall reassuring, suspect mild dehydration.  Encourage fluid rehydration, ibuprofen, Tylenol as needed, patient discharged in stable condition at this time, no evidence of acute cardiac abnormality or neurologic abnormality as etiology for his sudden syncope..   emergency department workup does not suggest an emergent condition requiring admission or immediate intervention beyond what has been performed at this time. The plan is: as above. The patient is safe for discharge and has been instructed to return immediately for worsening symptoms, change in symptoms or any other concerns.  Final Clinical Impression(s) / ED Diagnoses Final diagnoses:  Syncope and collapse  Injury of head, initial encounter    Rx / DC Orders ED Discharge Orders     None         Rosan Sherlean VEAR DEVONNA 01/05/24 1627    Armenta Canning, MD 01/05/24 2212
